# Patient Record
Sex: Female | Born: 1980 | Race: White | Hispanic: No | Smoking: Former smoker
Health system: Southern US, Community
[De-identification: ages and names within clinical notes are randomized; demographics above are authoritative.]

## PROBLEM LIST (undated history)

## (undated) DIAGNOSIS — K219 Gastro-esophageal reflux disease without esophagitis: Secondary | ICD-10-CM

## (undated) DIAGNOSIS — F419 Anxiety disorder, unspecified: Secondary | ICD-10-CM

## (undated) DIAGNOSIS — M4690 Unspecified inflammatory spondylopathy, site unspecified: Secondary | ICD-10-CM

## (undated) DIAGNOSIS — F329 Major depressive disorder, single episode, unspecified: Secondary | ICD-10-CM

## (undated) DIAGNOSIS — F4310 Post-traumatic stress disorder, unspecified: Secondary | ICD-10-CM

## (undated) DIAGNOSIS — J309 Allergic rhinitis, unspecified: Secondary | ICD-10-CM

## (undated) DIAGNOSIS — F603 Borderline personality disorder: Secondary | ICD-10-CM

## (undated) DIAGNOSIS — F32A Depression, unspecified: Secondary | ICD-10-CM

## (undated) HISTORY — PX: OTHER SURGICAL HISTORY: SHX169

## (undated) HISTORY — PX: EYE SURGERY: SHX253

## (undated) HISTORY — PX: ABDOMINAL HYSTERECTOMY: SHX81

## (undated) HISTORY — PX: TONSILLECTOMY: SUR1361

## (undated) HISTORY — PX: TOOTH EXTRACTION: SUR596

---

## 2007-12-05 ENCOUNTER — Emergency Department: Payer: Self-pay | Admitting: Emergency Medicine

## 2008-08-30 ENCOUNTER — Ambulatory Visit: Payer: Self-pay | Admitting: Rheumatology

## 2008-09-29 ENCOUNTER — Other Ambulatory Visit: Payer: Self-pay | Admitting: Diagnostic Radiology

## 2008-09-30 ENCOUNTER — Ambulatory Visit: Payer: Self-pay | Admitting: Rheumatology

## 2009-01-22 ENCOUNTER — Ambulatory Visit: Payer: Self-pay | Admitting: Family Medicine

## 2009-02-27 ENCOUNTER — Ambulatory Visit: Payer: Self-pay | Admitting: Neurology

## 2009-05-31 ENCOUNTER — Emergency Department: Payer: Self-pay | Admitting: Emergency Medicine

## 2009-06-05 ENCOUNTER — Emergency Department: Payer: Self-pay | Admitting: Emergency Medicine

## 2009-07-15 ENCOUNTER — Emergency Department: Payer: Self-pay | Admitting: Emergency Medicine

## 2009-08-18 ENCOUNTER — Observation Stay: Payer: Self-pay | Admitting: Obstetrics and Gynecology

## 2009-09-24 ENCOUNTER — Observation Stay: Payer: Self-pay | Admitting: Obstetrics and Gynecology

## 2009-10-18 ENCOUNTER — Observation Stay: Payer: Self-pay

## 2009-10-30 ENCOUNTER — Observation Stay: Payer: Self-pay

## 2009-11-22 ENCOUNTER — Observation Stay: Payer: Self-pay

## 2009-11-29 ENCOUNTER — Inpatient Hospital Stay: Payer: Self-pay

## 2010-03-12 ENCOUNTER — Emergency Department: Payer: Self-pay | Admitting: Emergency Medicine

## 2010-04-02 ENCOUNTER — Ambulatory Visit: Payer: Self-pay | Admitting: Obstetrics & Gynecology

## 2010-04-09 ENCOUNTER — Ambulatory Visit: Payer: Self-pay | Admitting: Obstetrics & Gynecology

## 2010-04-13 ENCOUNTER — Emergency Department: Payer: Self-pay | Admitting: Emergency Medicine

## 2011-02-04 ENCOUNTER — Emergency Department: Payer: Self-pay | Admitting: *Deleted

## 2012-03-30 ENCOUNTER — Ambulatory Visit: Payer: Self-pay | Admitting: Pain Medicine

## 2013-03-21 ENCOUNTER — Emergency Department: Payer: Self-pay | Admitting: Emergency Medicine

## 2014-05-22 ENCOUNTER — Emergency Department: Payer: Self-pay | Admitting: Emergency Medicine

## 2015-05-11 ENCOUNTER — Ambulatory Visit
Admission: RE | Admit: 2015-05-11 | Discharge: 2015-05-11 | Disposition: A | Discharge status: Home / Self Care | Payer: Medicare HMO | Source: Ambulatory Visit | Attending: Gastroenterology | Admitting: Gastroenterology

## 2015-05-11 ENCOUNTER — Ambulatory Visit: Payer: Medicare HMO | Admitting: Anesthesiology

## 2015-05-11 ENCOUNTER — Encounter
Admission: RE | Disposition: A | Discharge status: Home / Self Care | Payer: Self-pay | Source: Ambulatory Visit | Attending: Gastroenterology

## 2015-05-11 ENCOUNTER — Encounter: Payer: Self-pay | Admitting: *Deleted

## 2015-05-11 DIAGNOSIS — Z91018 Allergy to other foods: Secondary | ICD-10-CM | POA: Insufficient documentation

## 2015-05-11 DIAGNOSIS — F329 Major depressive disorder, single episode, unspecified: Secondary | ICD-10-CM | POA: Diagnosis not present

## 2015-05-11 DIAGNOSIS — Z888 Allergy status to other drugs, medicaments and biological substances status: Secondary | ICD-10-CM | POA: Insufficient documentation

## 2015-05-11 DIAGNOSIS — K640 First degree hemorrhoids: Secondary | ICD-10-CM | POA: Diagnosis not present

## 2015-05-11 DIAGNOSIS — F603 Borderline personality disorder: Secondary | ICD-10-CM | POA: Diagnosis not present

## 2015-05-11 DIAGNOSIS — Z91040 Latex allergy status: Secondary | ICD-10-CM | POA: Diagnosis not present

## 2015-05-11 DIAGNOSIS — Z87891 Personal history of nicotine dependence: Secondary | ICD-10-CM | POA: Insufficient documentation

## 2015-05-11 DIAGNOSIS — Z79899 Other long term (current) drug therapy: Secondary | ICD-10-CM | POA: Diagnosis not present

## 2015-05-11 DIAGNOSIS — Z800 Family history of malignant neoplasm of digestive organs: Secondary | ICD-10-CM | POA: Diagnosis not present

## 2015-05-11 DIAGNOSIS — Z90710 Acquired absence of both cervix and uterus: Secondary | ICD-10-CM | POA: Insufficient documentation

## 2015-05-11 DIAGNOSIS — Z8249 Family history of ischemic heart disease and other diseases of the circulatory system: Secondary | ICD-10-CM | POA: Diagnosis not present

## 2015-05-11 DIAGNOSIS — Z7982 Long term (current) use of aspirin: Secondary | ICD-10-CM | POA: Insufficient documentation

## 2015-05-11 DIAGNOSIS — Z803 Family history of malignant neoplasm of breast: Secondary | ICD-10-CM | POA: Insufficient documentation

## 2015-05-11 DIAGNOSIS — Z8349 Family history of other endocrine, nutritional and metabolic diseases: Secondary | ICD-10-CM | POA: Diagnosis not present

## 2015-05-11 DIAGNOSIS — J309 Allergic rhinitis, unspecified: Secondary | ICD-10-CM | POA: Insufficient documentation

## 2015-05-11 DIAGNOSIS — K529 Noninfective gastroenteritis and colitis, unspecified: Secondary | ICD-10-CM | POA: Insufficient documentation

## 2015-05-11 DIAGNOSIS — F4310 Post-traumatic stress disorder, unspecified: Secondary | ICD-10-CM | POA: Diagnosis not present

## 2015-05-11 DIAGNOSIS — F419 Anxiety disorder, unspecified: Secondary | ICD-10-CM | POA: Diagnosis not present

## 2015-05-11 DIAGNOSIS — M459 Ankylosing spondylitis of unspecified sites in spine: Secondary | ICD-10-CM | POA: Diagnosis not present

## 2015-05-11 DIAGNOSIS — K5900 Constipation, unspecified: Secondary | ICD-10-CM | POA: Insufficient documentation

## 2015-05-11 DIAGNOSIS — K219 Gastro-esophageal reflux disease without esophagitis: Secondary | ICD-10-CM | POA: Insufficient documentation

## 2015-05-11 HISTORY — DX: Post-traumatic stress disorder, unspecified: F43.10

## 2015-05-11 HISTORY — DX: Anxiety disorder, unspecified: F41.9

## 2015-05-11 HISTORY — PX: COLONOSCOPY WITH PROPOFOL: SHX5780

## 2015-05-11 HISTORY — DX: Depression, unspecified: F32.A

## 2015-05-11 HISTORY — DX: Gastro-esophageal reflux disease without esophagitis: K21.9

## 2015-05-11 HISTORY — DX: Major depressive disorder, single episode, unspecified: F32.9

## 2015-05-11 HISTORY — DX: Borderline personality disorder: F60.3

## 2015-05-11 HISTORY — DX: Unspecified inflammatory spondylopathy, site unspecified: M46.90

## 2015-05-11 HISTORY — DX: Allergic rhinitis, unspecified: J30.9

## 2015-05-11 SURGERY — COLONOSCOPY WITH PROPOFOL
Anesthesia: General

## 2015-05-11 MED ORDER — LIDOCAINE HCL (PF) 2 % IJ SOLN
INTRAMUSCULAR | Status: DC | PRN
  Administered 2015-05-11: 60 mg

## 2015-05-11 MED ORDER — MIDAZOLAM HCL 5 MG/5ML IJ SOLN
INTRAMUSCULAR | Status: DC | PRN
  Administered 2015-05-11: 1 mg via INTRAVENOUS

## 2015-05-11 MED ORDER — SODIUM CHLORIDE 0.9 % IV SOLN
INTRAVENOUS | Status: DC
  Administered 2015-05-11: 09:00:00 via INTRAVENOUS

## 2015-05-11 MED ORDER — FENTANYL CITRATE (PF) 100 MCG/2ML IJ SOLN
INTRAMUSCULAR | Status: DC | PRN
  Administered 2015-05-11: 50 ug via INTRAVENOUS

## 2015-05-11 MED ORDER — PROPOFOL 10 MG/ML IV BOLUS
INTRAVENOUS | Status: DC | PRN
  Administered 2015-05-11: 50 mg via INTRAVENOUS

## 2015-05-11 MED ORDER — PROPOFOL 500 MG/50ML IV EMUL
INTRAVENOUS | Status: DC | PRN
  Administered 2015-05-11: 100 ug/kg/min via INTRAVENOUS

## 2015-05-11 NOTE — Discharge Instructions (Signed)

## 2015-05-11 NOTE — Transfer of Care (Signed)
Immediate Anesthesia Transfer of Care Note  Patient: Elaine Arnold  Procedure(s) Performed: Procedure(s): COLONOSCOPY WITH PROPOFOL (N/A)  Patient Location: PACU  Anesthesia Type:General  Level of Consciousness: sedated  Airway & Oxygen Therapy: Patient Spontanous Breathing and Patient connected to nasal cannula oxygen  Post-op Assessment: Report given to RN and Post -op Vital signs reviewed and stable  Post vital signs: Reviewed  Last Vitals:  Filed Vitals:   05/11/15 0855 05/11/15 0957  BP: 117/65 97/54  Pulse: 74 66  Temp: 36.7 C 36.5 C  Resp: 20 18    Complications: No apparent anesthesia complications

## 2015-05-11 NOTE — H&P (Signed)
Primary Care Physician:  Dione Housekeeperlmedo, Mario Ernesto, MD  Pre-Procedure History & Physical: HPI:  Elaine Arnold is a 35 y.o. female is here for an colonoscopy.   Past Medical History  Diagnosis Date  . Allergic rhinitis   . Spondylitis (HCC)   . Anxiety   . Borderline personality disorder   . Depression   . PTSD (post-traumatic stress disorder)   . GERD (gastroesophageal reflux disease)     Past Surgical History  Procedure Laterality Date  . Tonsillectomy    . Abdominal hysterectomy    . Vaginal wall repair    . Tooth extraction    . Eye surgery      Prior to Admission medications   Medication Sig Start Date End Date Taking? Authorizing Provider  acetaminophen (TYLENOL) 500 MG tablet Take 500 mg by mouth every 6 (six) hours as needed.   Yes Historical Provider, MD  Adalimumab (HUMIRA) 40 MG/0.8ML PSKT Inject 40 mg into the skin every 30 (thirty) days.   Yes Historical Provider, MD  aspirin-acetaminophen-caffeine (EXCEDRIN MIGRAINE) 620 022 8338250-250-65 MG tablet Take 2 tablets by mouth every 6 (six) hours as needed for headache.   Yes Historical Provider, MD  cetirizine (ZYRTEC) 10 MG tablet Take 10 mg by mouth daily.   Yes Historical Provider, MD  chlorhexidine (PERIDEX) 0.12 % solution Use as directed 15 mLs in the mouth or throat 2 (two) times daily.   Yes Historical Provider, MD  citalopram (CELEXA) 20 MG tablet Take 40 mg by mouth daily.   Yes Historical Provider, MD  docusate sodium (COLACE) 100 MG capsule Take 100 mg by mouth 2 (two) times daily.   Yes Historical Provider, MD  lactulose (CEPHULAC) 10 g packet Take 10 g by mouth 3 (three) times daily.   Yes Historical Provider, MD  meloxicam (MOBIC) 15 MG tablet Take 15 mg by mouth daily.   Yes Historical Provider, MD  metaxalone (SKELAXIN) 800 MG tablet Take 800 mg by mouth 3 (three) times daily.   Yes Historical Provider, MD  pregabalin (LYRICA) 50 MG capsule Take 50 mg by mouth 3 (three) times daily.   Yes Historical Provider, MD   ranitidine (ZANTAC) 150 MG tablet Take 150 mg by mouth 2 (two) times daily.   Yes Historical Provider, MD  SUMAtriptan (IMITREX) 25 MG tablet Take 50 mg by mouth every 2 (two) hours as needed for migraine. May repeat in 2 hours if headache persists or recurs.   Yes Historical Provider, MD    Allergies as of 05/01/2015  . (Not on File)    History reviewed. No pertinent family history.  Social History   Social History  . Marital Status: Divorced    Spouse Name: N/A  . Number of Children: N/A  . Years of Education: N/A   Occupational History  . Not on file.   Social History Main Topics  . Smoking status: Former Games developermoker  . Smokeless tobacco: Not on file  . Alcohol Use: 0.0 oz/week    0 Glasses of wine per week  . Drug Use: No  . Sexual Activity: Not on file   Other Topics Concern  . Not on file   Social History Narrative     Physical Exam: BP 117/65 mmHg  Pulse 74  Temp(Src) 98.1 F (36.7 C) (Tympanic)  Resp 20  Ht 5\' 3"  (1.6 m)  Wt 98.431 kg (217 lb)  BMI 38.45 kg/m2  SpO2 100% General:   Alert,  pleasant and cooperative in NAD Head:  Normocephalic  and atraumatic. Neck:  Supple; no masses or thyromegaly. Lungs:  Clear throughout to auscultation.    Heart:  Regular rate and rhythm. Abdomen:  Soft, nontender and nondistended. Normal bowel sounds, without guarding, and without rebound.   Neurologic:  Alert and  oriented x4;  grossly normal neurologically.  Impression/Plan: Elaine Arnold is here for an colonoscopy to be performed for constipation  Risks, benefits, limitations, and alternatives regarding  colonoscopy have been reviewed with the patient.  Questions have been answered.  All parties agreeable.   Elaine Maxwell, MD  05/11/2015, 9:22 AM

## 2015-05-11 NOTE — Op Note (Signed)
[facility] Gastroenterology Patient Name: Elaine Arnold Procedure Date: 05/11/2015 9:24 AM MRN: [id number] Account #: [id number] Date of Birth: 1981/03/02 Admit Type: Outpatient Age: 35 Room: [facility] ENDO ROOM 4 Gender: Female Note Status: Finalized Procedure:            Colonoscopy Indications:          This is the patient's first colonoscopy, Change in                        stool caliber, Constipation Patient Profile:      This is a 35 year old female. Providers:            Rhona Raider. Shelle Iron, MD Referring MD:         Nat Christen. Zada Finders, MD (Referring MD) Medicines:            Propofol per Anesthesia Complications:        No immediate complications. Procedure:            Pre-Anesthesia Assessment:                       - Prior to the procedure, a History and Physical was                        performed, and patient medications, allergies and                        sensitivities were reviewed. The patient's tolerance of                        previous anesthesia was reviewed.                       - Prior to the procedure, a History and Physical was                        performed, and patient medications, allergies and                        sensitivities were reviewed. The patient's tolerance of                        previous anesthesia was reviewed.                       After obtaining informed consent, the colonoscope was                        passed under direct vision. Throughout the procedure,                        the patient's blood pressure, pulse, and oxygen                        saturations were monitored continuously. The                        Colonoscope was introduced through the anus and                        advanced to the the terminal ileum. The patient  tolerated the procedure well. The quality of the bowel                        preparation was good. Findings:      The perianal and digital rectal examinations were  normal.      Internal hemorrhoids were found during retroflexion. The hemorrhoids       were Grade I (internal hemorrhoids that do not prolapse).      A scattered area of mildly erythematous mucosa was found in the distal       rectum. Biopsies were taken with a cold forceps for histology. ? Prep       Artifact.      The exam was otherwise without abnormality on direct and retroflexion       views. Impression:           - Erythematous mucosa in the distal rectum. Biopsied. ?                        Prep artifact                       - The examination was otherwise normal on direct and                        retroflexion views. Recommendation:       - Observe patient in GI recovery unit.                       - Resume regular diet.                       - Continue present medications.                       - Await pathology results.                       - Recommend GI PT consult for likely pelvic floor                        dyssynchrony.                       - The findings and recommendations were discussed with                        the patient.                       - The findings and recommendations were discussed with                        the patient's family. Procedure Code(s):    --- Professional ---                       [phone], Colonoscopy, flexible; with biopsy, single or                        multiple Diagnosis Code(s):    --- Professional ---                       K62.89, Other specified diseases of anus and rectum  R19.5, Other fecal abnormalities                       K59.00, Constipation, unspecified CPT copyright 2016 American Medical Association. All rights reserved. The codes documented in this report are preliminary and upon coder review may  be revised to meet current compliance requirements. Kathalene FramesMatthew G Rein, MD 05/11/2015 10:02:54 AM This report has been signed electronically. Number of Addenda: 0 Note Initiated On: 05/11/2015 9:24  AM Scope Withdrawal Time: 0 hours 15 minutes 0 seconds  Total Procedure Duration: 0 hours 25 minutes 13 seconds       [facility]

## 2015-05-11 NOTE — Anesthesia Postprocedure Evaluation (Signed)
Anesthesia Post Note  Patient: Elaine Arnold  Procedure(s) Performed: Procedure(s) (LRB): COLONOSCOPY WITH PROPOFOL (N/A)  Patient location during evaluation: Endoscopy Anesthesia Type: General Level of consciousness: awake and alert Pain management: pain level controlled Vital Signs Assessment: post-procedure vital signs reviewed and stable Respiratory status: spontaneous breathing, nonlabored ventilation, respiratory function stable and patient connected to nasal cannula oxygen Cardiovascular status: blood pressure returned to baseline and stable Postop Assessment: no signs of nausea or vomiting Anesthetic complications: no    Last Vitals:  Filed Vitals:   05/11/15 1020 05/11/15 1030  BP: 101/73 99/78  Pulse: 62 64  Temp:    Resp: 13 12    Last Pain: There were no vitals filed for this visit.               Cleda MccreedyJoseph K Piscitello

## 2015-05-11 NOTE — Anesthesia Preprocedure Evaluation (Signed)
Anesthesia Evaluation  Patient identified by MRN, date of birth, ID band Patient awake    Reviewed: Allergy & Precautions, H&P , NPO status , Patient's Chart, lab work & pertinent test results  History of Anesthesia Complications Negative for: history of anesthetic complications  Airway Mallampati: III  TM Distance: >3 FB Neck ROM: limited    Dental  (+) Poor Dentition   Pulmonary neg shortness of breath, former smoker,    Pulmonary exam normal breath sounds clear to auscultation       Cardiovascular Exercise Tolerance: Good (-) angina(-) Past MI and (-) DOE Normal cardiovascular exam Rhythm:regular Rate:Normal     Neuro/Psych PSYCHIATRIC DISORDERS Anxiety Depression negative neurological ROS     GI/Hepatic Neg liver ROS, GERD  Controlled,  Endo/Other  negative endocrine ROS  Renal/GU negative Renal ROS  negative genitourinary   Musculoskeletal negative musculoskeletal ROS (+)   Abdominal   Peds negative pediatric ROS (+)  Hematology negative hematology ROS (+)   Anesthesia Other Findings Past Medical History:   Allergic rhinitis                                            Spondylitis (HCC)                                            Anxiety                                                      Borderline personality disorder                              Depression                                                   PTSD (post-traumatic stress disorder)                        GERD (gastroesophageal reflux disease)                      Past Surgical History:   TONSILLECTOMY                                                 ABDOMINAL HYSTERECTOMY                                        vaginal wall repair                                           TOOTH EXTRACTION  EYE SURGERY                                                  BMI    Body Mass Index   38.44 kg/m 2      Reproductive/Obstetrics negative OB ROS                             Anesthesia Physical Anesthesia Plan  ASA: III  Anesthesia Plan: General   Post-op Pain Management:    Induction:   Airway Management Planned:   Additional Equipment:   Intra-op Plan:   Post-operative Plan:   Informed Consent: I have reviewed the patients History and Physical, chart, labs and discussed the procedure including the risks, benefits and alternatives for the proposed anesthesia with the patient or authorized representative who has indicated his/her understanding and acceptance.   Dental Advisory Given  Plan Discussed with: Anesthesiologist, CRNA and Surgeon  Anesthesia Plan Comments:         Anesthesia Quick Evaluation

## 2015-05-12 ENCOUNTER — Encounter: Payer: Self-pay | Admitting: Gastroenterology

## 2015-05-14 LAB — SURGICAL PATHOLOGY

## 2015-06-05 ENCOUNTER — Other Ambulatory Visit: Payer: Self-pay | Admitting: Neurology

## 2015-06-05 DIAGNOSIS — R29898 Other symptoms and signs involving the musculoskeletal system: Secondary | ICD-10-CM

## 2015-06-05 DIAGNOSIS — M542 Cervicalgia: Secondary | ICD-10-CM

## 2015-06-25 ENCOUNTER — Ambulatory Visit: Payer: Medicare HMO | Attending: Neurology | Admitting: Occupational Therapy

## 2015-06-25 DIAGNOSIS — M6281 Muscle weakness (generalized): Secondary | ICD-10-CM | POA: Diagnosis present

## 2015-06-25 DIAGNOSIS — R202 Paresthesia of skin: Secondary | ICD-10-CM | POA: Diagnosis present

## 2015-06-25 NOTE — Therapy (Signed)
[facility] PHYSICAL AND SPORTS MEDICINE [address] [place], [place], [address] Phone: [phone]   Fax:  [phone]  Occupational Therapy Treatment  Patient Details  Name: Elaine Arnold MRN: [id number] Date of Birth: 09/10/1980 Referring Provider: Sherryll BurgerShah  Encounter Date: 06/25/2015      OT End of Session - 06/25/15 1447    Visit Number 1   Number of Visits 4   Date for OT Re-Evaluation 07/23/15   OT Start Time 1133   OT Stop Time 1223   OT Time Calculation (min) 50 min   Activity Tolerance Patient tolerated treatment well   Behavior During Therapy [facility] for tasks assessed/performed      Past Medical History  Diagnosis Date  . Allergic rhinitis   . Spondylitis (HCC)   . Anxiety   . Borderline personality disorder   . Depression   . PTSD (post-traumatic stress disorder)   . GERD (gastroesophageal reflux disease)     Past Surgical History  Procedure Laterality Date  . Tonsillectomy    . Abdominal hysterectomy    . Vaginal wall repair    . Tooth extraction    . Eye surgery    . Colonoscopy with propofol N/A 05/11/2015    Procedure: COLONOSCOPY WITH PROPOFOL;  Surgeon: Elnita MaxwellMatthew Gordon Rein, MD;  [facility];  Service: Endoscopy;  Laterality: N/A;    There were no vitals filed for this visit.      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 Yes   Pain Score 3    Pain Location Arm   Pain Orientation Right;Left   Pain Descriptors / Indicators Pins and needles            [facility] OT Assessment - 06/25/15 0001    Assessment   Diagnosis Bilateral arm weakness    Referring Provider  Sherryll BurgerShah   Onset Date 01/02/15   Home  Environment   Lives With Family   Prior Function   Level of Independence Independent  on bad days she needs help with ADL"s and legs gets weak   Vocation Part time employment   Leisure Play games ( video and board) , drawing , reading , gardening    Strength   Right Hand Grip (lbs) 68   Right Hand Lateral Pinch 20 lbs   Right Hand 3 Point Pinch 20 lbs   Left Hand Grip (lbs) 37   Left Hand Lateral Pinch 15 lbs   Left Hand 3 Point Pinch 19 lbs                          OT Education - 06/25/15 1447    Education provided Yes   Person(s) Educated Patient   Methods Explanation;Demonstration;Tactile cues;Verbal cues;Handout   Comprehension Verbal cues required;Returned demonstration;Verbalized understanding          OT Short Term Goals - 06/25/15 1501    OT SHORT TERM GOAL #1   Title Pt grip strength in L hand improve wth 10 lbs to  hold pots Arnold    Baseline R grip 68; L 37lbs    Time 3   Period Weeks   Status New           OT Long Term Goals - 06/25/15 1501    OT LONG TERM GOAL #1   Title Pt show improvement in function on PRWHE by 10 points by doing HEP   Baseline Function score 24/50 on PRWHE    Time 4   Period Weeks   Status New      Pt ed on HEP - see pt instruction           Plan - 06/25/15 1448    Clinical Impression Statement Pt refer by DR Sherryll Burger for bilateral UE weakness and numbness - pt tested normal sensation on Sem[facility] Weinstein - pt show decrease strength in L UE from shoulder to wrist 4-/5 compare to R 4+/5 - fatigue quick  with increase reps or resistance ; pt  grip decrease in L more than R -  and below norm - Prehension strenght in  R hand L was  within normal range - pt has cervical MRI tomorrow - had normal  nerve conduction -pt was provided HEP for grip strengthening    Rehab Potential Fair   OT Frequency 1x / week   OT Duration 4 weeks   OT Treatment/Interventions Self-care/ADL  training;DME and/or AE instruction;Patient/family education;Therapeutic exercises   Plan Check on MRI results ,   OT Home Exercise Plan see pt instruction    Consulted and Agree with Plan of Care Patient      Patient will benefit from skilled therapeutic intervention in order to improve the following deficits and impairments:  Decreased strength, Impaired UE functional use, Impaired sensation  Visit Diagnosis: Tingling sensation - Plan: Ot plan of care cert/re-cert  Muscle weakness (generalized) - Plan: Ot plan of care cert/re-cert    Problem List There are no active problems to display for this patient.   Oletta Cohn OTR/L,CLT  06/25/2015, 3:09 PM  [facility] PHYSICAL AND SPORTS MEDICINE [address] [place], [address] Phone: [phone]   Fax:  [phone]  Name: Elaine Arnold MRN: [id number] Date of Birth: Aug 29, 1980

## 2015-06-25 NOTE — Patient Instructions (Signed)
Pt was provided HEP for green putty for grip , pulling with ulnar 2 digits, and pulling 5 fingers ( taffy)  10-12 reps  1-2 x day

## 2015-06-26 ENCOUNTER — Ambulatory Visit
Admission: RE | Admit: 2015-06-26 | Discharge: 2015-06-26 | Disposition: A | Discharge status: Home / Self Care | Payer: Medicare HMO | Source: Ambulatory Visit | Attending: Neurology | Admitting: Neurology

## 2015-06-26 DIAGNOSIS — R200 Anesthesia of skin: Secondary | ICD-10-CM | POA: Insufficient documentation

## 2015-06-26 DIAGNOSIS — R29898 Other symptoms and signs involving the musculoskeletal system: Secondary | ICD-10-CM | POA: Diagnosis present

## 2015-06-26 DIAGNOSIS — M50223 Other cervical disc displacement at C6-C7 level: Secondary | ICD-10-CM | POA: Insufficient documentation

## 2015-06-26 DIAGNOSIS — M542 Cervicalgia: Secondary | ICD-10-CM | POA: Diagnosis present

## 2015-07-05 ENCOUNTER — Ambulatory Visit: Payer: Medicare HMO | Admitting: Occupational Therapy

## 2015-07-11 ENCOUNTER — Ambulatory Visit: Payer: Medicare HMO | Attending: Neurology | Admitting: Occupational Therapy

## 2015-07-11 DIAGNOSIS — M6281 Muscle weakness (generalized): Secondary | ICD-10-CM | POA: Insufficient documentation

## 2015-07-11 DIAGNOSIS — R202 Paresthesia of skin: Secondary | ICD-10-CM | POA: Insufficient documentation
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
## 2015-07-11 NOTE — Patient Instructions (Signed)
Cont with HEP   Pt to do putty and exercises with not tight grip - "soft hands"  Cont with same putty   add 1 lbs for wrist in all planes - do need to focus on keep alignment of wrist and hand good during extention and pronation/sup - wants to deviate or flex  10 reps each  2 lbs for elbow flexion in supination and neutral - not able to do in pronation  10 reps each  Triceps 2 lbs - 10 reps  Add to HEP   Keep pain under 2/10

## 2015-07-19 ENCOUNTER — Ambulatory Visit: Payer: Medicare HMO | Admitting: Occupational Therapy

## 2015-11-13 ENCOUNTER — Encounter: Payer: Self-pay | Admitting: Emergency Medicine

## 2015-11-13 ENCOUNTER — Emergency Department: Payer: Medicare HMO

## 2015-11-13 ENCOUNTER — Emergency Department
Admission: EM | Admit: 2015-11-13 | Discharge: 2015-11-13 | Disposition: A | Discharge status: Home / Self Care | Payer: Medicare HMO | Attending: Emergency Medicine | Admitting: Emergency Medicine

## 2015-11-13 DIAGNOSIS — Z91040 Latex allergy status: Secondary | ICD-10-CM | POA: Insufficient documentation

## 2015-11-13 DIAGNOSIS — Z87891 Personal history of nicotine dependence: Secondary | ICD-10-CM | POA: Diagnosis not present

## 2015-11-13 DIAGNOSIS — Z791 Long term (current) use of non-steroidal anti-inflammatories (NSAID): Secondary | ICD-10-CM | POA: Insufficient documentation

## 2015-11-13 DIAGNOSIS — R1030 Lower abdominal pain, unspecified: Secondary | ICD-10-CM | POA: Insufficient documentation

## 2015-11-13 DIAGNOSIS — Z79899 Other long term (current) drug therapy: Secondary | ICD-10-CM | POA: Diagnosis not present

## 2015-11-13 DIAGNOSIS — M545 Low back pain, unspecified: Secondary | ICD-10-CM

## 2015-11-13 DIAGNOSIS — R109 Unspecified abdominal pain: Secondary | ICD-10-CM

## 2015-11-13 LAB — URINALYSIS COMPLETE WITH MICROSCOPIC (ARMC ONLY)
Bacteria, UA: NONE SEEN
Bilirubin Urine: NEGATIVE
GLUCOSE, UA: NEGATIVE mg/dL
Hgb urine dipstick: NEGATIVE
KETONES UR: NEGATIVE mg/dL
Leukocytes, UA: NEGATIVE
Nitrite: NEGATIVE
PROTEIN: NEGATIVE mg/dL
SQUAMOUS EPITHELIAL / LPF: NONE SEEN
Specific Gravity, Urine: 1.002 — ABNORMAL LOW (ref 1.005–1.030)
pH: 6 (ref 5.0–8.0)

## 2015-11-13 LAB — COMPREHENSIVE METABOLIC PANEL
ALBUMIN: 4.4 g/dL (ref 3.5–5.0)
ALK PHOS: 100 U/L (ref 38–126)
ALT: 28 U/L (ref 14–54)
AST: 23 U/L (ref 15–41)
Anion gap: 7 (ref 5–15)
BUN: 15 mg/dL (ref 6–20)
CALCIUM: 9.7 mg/dL (ref 8.9–10.3)
CHLORIDE: 106 mmol/L (ref 101–111)
CO2: 25 mmol/L (ref 22–32)
CREATININE: 0.67 mg/dL (ref 0.44–1.00)
GFR calc non Af Amer: >60 mL/min (ref >60)
GLUCOSE: 87 mg/dL (ref 65–99)
Potassium: 3.8 mmol/L (ref 3.5–5.1)
SODIUM: 138 mmol/L (ref 135–145)
Total Bilirubin: 0.2 mg/dL — ABNORMAL LOW (ref 0.3–1.2)
Total Protein: 7.9 g/dL (ref 6.5–8.1)

## 2015-11-13 LAB — CBC
HCT: 37.4 % (ref 35.0–47.0)
Hemoglobin: 12.9 g/dL (ref 12.0–16.0)
MCH: 28.1 pg (ref 26.0–34.0)
MCHC: 34.5 g/dL (ref 32.0–36.0)
MCV: 81.4 fL (ref 80.0–100.0)
PLATELETS: 253 10*3/uL (ref 150–440)
RBC: 4.6 MIL/uL (ref 3.80–5.20)
RDW: 13.1 % (ref 11.5–14.5)
WBC: 7.3 10*3/uL (ref 3.6–11.0)

## 2015-11-13 LAB — LIPASE, BLOOD: LIPASE: 28 U/L (ref 11–51)

## 2015-11-13 MED ORDER — KETOROLAC TROMETHAMINE 30 MG/ML IJ SOLN
INTRAMUSCULAR | Status: AC
  Filled 2015-11-13: qty 1

## 2015-11-13 MED ORDER — LIDOCAINE 5 % EX PTCH
MEDICATED_PATCH | CUTANEOUS | Status: AC
  Administered 2015-11-13: 1 via TRANSDERMAL
  Filled 2015-11-13: qty 1

## 2015-11-13 MED ORDER — ONDANSETRON HCL 4 MG/2ML IJ SOLN
4.0000 mg | Freq: Once | INTRAMUSCULAR | Status: AC
  Administered 2015-11-13: 4 mg via INTRAVENOUS

## 2015-11-13 MED ORDER — KETOROLAC TROMETHAMINE 30 MG/ML IJ SOLN
30.0000 mg | Freq: Once | INTRAMUSCULAR | Status: AC
  Administered 2015-11-13: 30 mg via INTRAVENOUS

## 2015-11-13 MED ORDER — ONDANSETRON HCL 4 MG/2ML IJ SOLN
INTRAMUSCULAR | Status: AC
  Filled 2015-11-13: qty 2

## 2015-11-13 MED ORDER — HYDROMORPHONE HCL 1 MG/ML IJ SOLN
INTRAMUSCULAR | Status: AC
  Filled 2015-11-13: qty 1

## 2015-11-13 MED ORDER — ONDANSETRON 4 MG PO TBDP
4.0000 mg | ORAL_TABLET | Freq: Once | ORAL | Status: DC
  Filled 2015-11-13: qty 1

## 2015-11-13 MED ORDER — KETOROLAC TROMETHAMINE 30 MG/ML IJ SOLN
30.0000 mg | Freq: Once | INTRAMUSCULAR | Status: AC
  Administered 2015-11-13: 30 mg via INTRAMUSCULAR
  Filled 2015-11-13: qty 1

## 2015-11-13 MED ORDER — HYDROMORPHONE HCL 1 MG/ML IJ SOLN
0.5000 mg | Freq: Once | INTRAMUSCULAR | Status: AC
  Administered 2015-11-13: 0.5 mg via INTRAVENOUS

## 2015-11-13 MED ORDER — LIDOCAINE 5 % EX PTCH
1.0000 | MEDICATED_PATCH | Freq: Once | CUTANEOUS | Status: DC
  Administered 2015-11-13: 1 via TRANSDERMAL

## 2015-11-13 NOTE — ED Notes (Signed)
Pt returned from CT °

## 2015-11-13 NOTE — ED Notes (Signed)
Pt went to CT

## 2015-11-13 NOTE — ED Provider Notes (Signed)
Time Seen: Approximately 2009  I have reviewed the triage notes  Chief Complaint: Back Pain and Abdominal Pain   History of Present Illness: Elaine Arnold is a 35 y.o. female *who presents with some acute onset of some right-sided lower back discomfort lateral to the L2-L3 region. Patient has a long history of musculoskeletal back pain. She felt this pain was somewhat different and point tender with some associated nausea and urinary frequency and also describes some burning with urination. She has some mild lower abdominal discomfort without any fever. She denies any left-sided flank or abdominal pain at this time. She states that there was one area that was point tender. She's had a complete hysterectomy and has no history of bowel issues such as diarrhea or constipation. Patient went to an urgent care and was concerned for the possibility of renal colic since patient's on Topamax and also vitamin D supplements   Past Medical History:  Diagnosis Date  . Allergic rhinitis   . Anxiety   . Borderline personality disorder   . Depression   . GERD (gastroesophageal reflux disease)   . PTSD (post-traumatic stress disorder)   . Spondylitis (HCC)     There are no active problems to display for this patient.   Past Surgical History:  Procedure Laterality Date  . ABDOMINAL HYSTERECTOMY    . COLONOSCOPY WITH PROPOFOL N/A 05/11/2015   Procedure: COLONOSCOPY WITH PROPOFOL;  Surgeon: Elnita Maxwell, MD;  Location: [facility] ENDOSCOPY;  Service: Endoscopy;  Laterality: N/A;  . EYE SURGERY    . TONSILLECTOMY    . TOOTH EXTRACTION    . vaginal wall repair      Past Surgical History:  Procedure Laterality Date  . ABDOMINAL HYSTERECTOMY    . COLONOSCOPY WITH PROPOFOL N/A 05/11/2015   Procedure: COLONOSCOPY WITH PROPOFOL;  Surgeon: Elnita Maxwell, MD;  Location: [facility] ENDOSCOPY;  Service: Endoscopy;  Laterality: N/A;  . EYE SURGERY    . TONSILLECTOMY    . TOOTH EXTRACTION    . vaginal  wall repair      Current Outpatient Rx  . Order #: [id number] Class: Historical Med  . Order #: [id number] Class: Historical Med  . Order #: [id number] Class: Historical Med  . Order #: [id number] Class: Historical Med  . Order #: [id number] Class: Historical Med  . Order #: [id number] Class: Historical Med  . Order #: [id number] Class: Historical Med  . Order #: [id number] Class: Historical Med  . Order #: [id number] Class: Historical Med  . Order #: [id number] Class: Historical Med  . Order #: [id number] Class: Historical Med  . Order #: [id number] Class: Historical Med  . Order #: [id number] Class: Historical Med    Allergies:  Effexor [venlafaxine]; Escitalopram; Latex; Mango flavor; and Propranolol  Family History: No family history on file.  Social History: Social History  Substance Use Topics  . Smoking status: Former Games developer  . Smokeless tobacco: Never Used  . Alcohol use 0.0 oz/week     Review of Systems:   10 point review of systems was performed and was otherwise negative:  Constitutional: No fever Eyes: No visual disturbances ENT: No sore throat, ear pain Cardiac: No chest pain Respiratory: No shortness of breath, wheezing, or stridor Abdomen: No Current abdominal pain, no vomiting, No diarrhea Endocrine: No weight loss, No night sweats Extremities: No peripheral edema, cyanosis Skin: No rashes, easy bruising Neurologic: No focal weakness, trouble with speech or swollowing Urologic: No obvious hematuria She points mainly to the right back region and denies any new weakness in either upper  or lower extremities  Physical Exam:  ED Triage Vitals  Enc Vitals Group     BP 11/13/15 1929 105/65     Pulse Rate 11/13/15 1929 85     Resp 11/13/15 1929 18     Temp 11/13/15 1929 98.4 F (36.9 C)     Temp Source 11/13/15 1929 Oral     SpO2 11/13/15 1929 100 %     Weight 11/13/15 1930 210 lb (95.3 kg)     Height 11/13/15 1930 5\' 3"  (1.6 m)     Head Circumference --      Peak Flow  --      Pain Score 11/13/15 1930 8     Pain Loc --      Pain Edu? --      Excl. in GC? --     General: Awake , Alert , and Oriented times 3; GCS 15 Head: Normal cephalic , atraumatic Eyes: Pupils equal , round, reactive to light Nose/Throat: No nasal drainage, patent upper airway without erythema or exudate.  Neck: Supple, Full range of motion, No anterior adenopathy or palpable thyroid masses Lungs: Clear to ascultation without wheezes , rhonchi, or rales Heart: Regular rate, regular rhythm without murmurs , gallops , or rubs Abdomen: Soft, non tender without rebound, guarding , or rigidity; bowel sounds positive and symmetric in all 4 quadrants. No organomegaly .        Extremities: 2 plus symmetric pulses. No edema, clubbing or cyanosis Neurologic: normal ambulation, Motor symmetric without deficits, sensory intact Skin: warm, dry, no rashes Patient has some point tenderness proximally at the L2-L3 region to direct palpation on the right side. Negative straight leg raise or any signs of [place] can  Labs:   All laboratory work was reviewed including any pertinent negatives or positives listed below:  Labs Reviewed  COMPREHENSIVE METABOLIC PANEL - Abnormal; Notable for the following:       Result Value   Total Bilirubin 0.2 (*)    All other components within normal limits  URINALYSIS COMPLETEWITH MICROSCOPIC ([facility] ONLY) - Abnormal; Notable for the following:    Color, Urine COLORLESS (*)    APPearance CLEAR (*)    Specific Gravity, Urine 1.002 (*)    All other components within normal limits  LIPASE, BLOOD  CBC   Laboratory work was reviewed and showed no clinically significant abnormalities.  Radiology:  "Ct Renal Stone Study  Result Date: 11/13/2015 CLINICAL DATA:  Stabbing low back pain today with urinary frequency and dysuria. EXAM: CT ABDOMEN AND PELVIS WITHOUT CONTRAST TECHNIQUE: Multidetector CT imaging of the abdomen and pelvis was performed following the standard  protocol without IV contrast. COMPARISON:  None. FINDINGS: Lower chest: No acute abnormality. Hepatobiliary: No focal liver abnormality is seen. No gallstones, gallbladder wall thickening, or biliary dilatation. Pancreas: Unremarkable. No pancreatic ductal dilatation or surrounding inflammatory changes. Spleen: Normal in size without focal abnormality. Adrenals/Urinary Tract: Small nonobstructing kidney stones are identified in both kidneys. There is no hydronephrosis bilaterally. The adrenal glands are normal. The bladder is normal. Stomach/Bowel: Stomach is within normal limits. No evidence of bowel wall thickening, distention, or inflammatory changes. Appendix appears normal. Vascular/Lymphatic: No significant vascular findings are present. No enlarged abdominal or pelvic lymph nodes. Reproductive: Status post hysterectomy. No adnexal masses. Other: There are small bilateral inguinal herniation of mesenteric fat and small herniation of mesenteric fat in the umbilicus. Musculoskeletal: No acute or significant osseous findings. IMPRESSION: Bilateral nephrolithiasis without hydroureteronephrosis bilaterally. Prior hysterectomy. Electronically Signed  By: Sherian Rein M.D.   On: 11/13/2015 21:21  "  I personally reviewed the radiologic studies    ED Course:  Patient's stay here was uneventful and it did not appear that she had acute renal colic. There is a presence of kidney stones likely associated with the patient's medications. The patient has point tenderness to palpation this seemed to be more musculoskeletal muscle spasm type of discomfort. I felt was unlikely this was cauda equina or she had a care at this time. Patient had symptomatic improvement with Toradol, Dilaudid, and Zofran.  Clinical Course        Final Clinical Impression:   Final diagnoses:  Acute right flank pain  Right-sided low back pain without sciatica     Plan:  Outpatient " Discharge Medication List as of  11/13/2015  9:48 PM    " Patient states that she has Voltaren gel at home and also some other pain medication she can try. I felt it was necessary to stay away from prescription narcotic pain medication at this time.  Patient was advised to return immediately if condition worsens. Patient was advised to follow up with their primary care physician or other specialized physicians involved in their outpatient care. The patient and/or family member/power of attorney had laboratory results reviewed at the bedside. All questions and concerns were addressed and appropriate discharge instructions were distributed by the nursing staff.           Jennye Moccasin, MD 11/13/15 2224

## 2015-11-13 NOTE — ED Triage Notes (Signed)
Pt presents to ED with c/o lower back pain today, reports when pt stands up pt states feels a tightening and stabbing sensation to her back. Pt c/o nausea, urinary frequency, and dysuria. Pt c/o lower abdominal pain. Pt denies chest pain or shortness of breath. Pt reports was sent from urgent care to rule out kidney stone.

## 2015-11-13 NOTE — Discharge Instructions (Signed)
Please continue your normal pain medication at home when you can use the Voltaren gel along with over-the-counter lidocaine for back discomfort. Please contact your primary physician for further outpatient follow-up . Drink plenty of fluids and return here for any new leg weakness, difficulty with bladder or bowel function, fever or any other new concerns.  Please return immediately if condition worsens. Please contact her primary physician or the physician you were given for referral. If you have any specialist physicians involved in her treatment and plan please also contact them. Thank you for using [facility].

## 2016-08-30 ENCOUNTER — Emergency Department: Payer: Medicare HMO

## 2016-08-30 ENCOUNTER — Emergency Department
Admission: EM | Admit: 2016-08-30 | Discharge: 2016-08-30 | Disposition: A | Discharge status: Home / Self Care | Payer: Medicare HMO | Attending: Emergency Medicine | Admitting: Emergency Medicine

## 2016-08-30 ENCOUNTER — Encounter: Payer: Self-pay | Admitting: Emergency Medicine

## 2016-08-30 DIAGNOSIS — Z79899 Other long term (current) drug therapy: Secondary | ICD-10-CM | POA: Diagnosis not present

## 2016-08-30 DIAGNOSIS — R202 Paresthesia of skin: Secondary | ICD-10-CM | POA: Diagnosis not present

## 2016-08-30 DIAGNOSIS — R569 Unspecified convulsions: Secondary | ICD-10-CM | POA: Diagnosis not present

## 2016-08-30 DIAGNOSIS — R531 Weakness: Secondary | ICD-10-CM | POA: Diagnosis present

## 2016-08-30 LAB — CBC
HCT: 46.3 % (ref 35.0–47.0)
HEMOGLOBIN: 15.7 g/dL (ref 12.0–16.0)
MCH: 27.8 pg (ref 26.0–34.0)
MCHC: 34 g/dL (ref 32.0–36.0)
MCV: 81.8 fL (ref 80.0–100.0)
Platelets: 237 10*3/uL (ref 150–440)
RBC: 5.66 MIL/uL — AB (ref 3.80–5.20)
RDW: 14.7 % — ABNORMAL HIGH (ref 11.5–14.5)
WBC: 6.9 10*3/uL (ref 3.6–11.0)

## 2016-08-30 LAB — BASIC METABOLIC PANEL
ANION GAP: 6 (ref 5–15)
BUN: 23 mg/dL — ABNORMAL HIGH (ref 6–20)
CALCIUM: 9 mg/dL (ref 8.9–10.3)
CHLORIDE: 106 mmol/L (ref 101–111)
CO2: 26 mmol/L (ref 22–32)
Creatinine, Ser: 0.95 mg/dL (ref 0.44–1.00)
GFR calc non Af Amer: >60 mL/min (ref >60)
Glucose, Bld: 89 mg/dL (ref 65–99)
Potassium: 3.4 mmol/L — ABNORMAL LOW (ref 3.5–5.1)
Sodium: 138 mmol/L (ref 135–145)

## 2016-08-30 LAB — URINALYSIS, COMPLETE (UACMP) WITH MICROSCOPIC
BILIRUBIN URINE: NEGATIVE
Bacteria, UA: NONE SEEN
GLUCOSE, UA: NEGATIVE mg/dL
Hgb urine dipstick: NEGATIVE
KETONES UR: NEGATIVE mg/dL
Nitrite: NEGATIVE
PROTEIN: NEGATIVE mg/dL
Specific Gravity, Urine: 1.019 (ref 1.005–1.030)
pH: 6 (ref 5.0–8.0)

## 2016-08-30 NOTE — Discharge Instructions (Signed)
As we discussed it is very important that you do not drive, go up on roofs, swim, or put yourself in any situation that might be dangerous for you or others if you were to have another seizure until you are cleared by a neurologist. Please seek medical attention for any high fevers, chest pain, shortness of breath, change in behavior, persistent vomiting, bloody stool or any other new or concerning symptoms. ° °

## 2016-08-30 NOTE — ED Notes (Signed)
Pt able to ambulate to the bathroom without difficulty and pt denies dizziness at this time time.

## 2016-08-30 NOTE — ED Triage Notes (Addendum)
Pt states that this morning around 0330 she woke up and was confused, pt states that she was disoriented and had urinated and defecated on herself. Pt states that she got up to take a shower and then went back to bed. Pt states that when she woke up she was disoriented, she did not know what time it was or where she was and she felt "cold down there, like I had been bleeding but i've had my uterus removed." Pt states that she drove herself here, pt is A & O x 4 in triage, states that she feels weak, her vision is blurrier than usual, and she has "tingling' in all 4 extremities and in her face.

## 2016-08-30 NOTE — ED Provider Notes (Signed)
[facility] Emergency Department Provider Note  ____________________________________________   I have reviewed the triage vital signs and the nursing notes.   HISTORY  Chief Complaint Weakness and Tingling   History limited by: Not Limited   HPI Elaine Arnold is a 36 y.o. female who presents to the emergency department today because of concerns for an episode that occurred last night. She states that she woke up about 3:30 AM with cramping in all 4 extremities. She states she was very confused at that time. She was noted to have urinated and defecated on herself. He started to become more oriented and was able to shower. She then went back to bed. The patient states that today after waking up she is felt off. She has felt like her head is been heavy. She has no history of seizures.   Past Medical History:  Diagnosis Date  . Allergic rhinitis   . Anxiety   . Borderline personality disorder   . Depression   . GERD (gastroesophageal reflux disease)   . PTSD (post-traumatic stress disorder)   . Spondylitis (HCC)     There are no active problems to display for this patient.   Past Surgical History:  Procedure Laterality Date  . ABDOMINAL HYSTERECTOMY    . COLONOSCOPY WITH PROPOFOL N/A 05/11/2015   Procedure: COLONOSCOPY WITH PROPOFOL;  Surgeon: Elnita Maxwell, MD;  Location: [facility] ENDOSCOPY;  Service: Endoscopy;  Laterality: N/A;  . EYE SURGERY    . TONSILLECTOMY    . TOOTH EXTRACTION    . vaginal wall repair      Prior to Admission medications   Medication Sig Start Date End Date Taking? Authorizing Provider  acetaminophen (TYLENOL) 500 MG tablet Take 500 mg by mouth every 6 (six) hours as needed.    [provider]  Adalimumab (HUMIRA) 40 MG/0.8ML PSKT Inject 40 mg into the skin every 30 (thirty) days.    [provider]  aspirin-acetaminophen-caffeine (EXCEDRIN MIGRAINE) [phone] MG tablet Take 2 tablets by mouth every 6  (six) hours as needed for headache.    [provider]  cetirizine (ZYRTEC) 10 MG tablet Take 10 mg by mouth daily.    [provider]  chlorhexidine (PERIDEX) 0.12 % solution Use as directed 15 mLs in the mouth or throat 2 (two) times daily.    [provider]  citalopram (CELEXA) 20 MG tablet Take 40 mg by mouth daily.    [provider]  docusate sodium (COLACE) 100 MG capsule Take 100 mg by mouth 2 (two) times daily.    [provider]  lactulose (CEPHULAC) 10 g packet Take 10 g by mouth 3 (three) times daily.    [provider]  meloxicam (MOBIC) 15 MG tablet Take 15 mg by mouth daily.    [provider]  metaxalone (SKELAXIN) 800 MG tablet Take 800 mg by mouth 3 (three) times daily.    [provider]  pregabalin (LYRICA) 50 MG capsule Take 50 mg by mouth 3 (three) times daily.    [provider]  ranitidine (ZANTAC) 150 MG tablet Take 150 mg by mouth 2 (two) times daily.    [provider]  SUMAtriptan (IMITREX) 25 MG tablet Take 50 mg by mouth every 2 (two) hours as needed for migraine. May repeat in 2 hours if headache persists or recurs.    [provider]    Allergies Effexor [venlafaxine]; Escitalopram; Latex; Mango flavor; and Propranolol  No family history on  file.  Social History Social History  Substance Use Topics  . Smoking status: Former Games developer  . Smokeless tobacco: Never Used  . Alcohol use 0.0 oz/week    Review of Systems Constitutional: No fever/chills Eyes: Positive for blurry vision. ENT: No sore throat. Cardiovascular: Denies chest pain. Respiratory: Denies shortness of breath. Gastrointestinal: No abdominal pain.  No nausea, no vomiting.  No diarrhea.   Genitourinary: Positive for incontinence.  Musculoskeletal: Positive for muscle cramps. Skin: Negative for rash. Neurological: Positive for heaviness.    ____________________________________________   PHYSICAL EXAM:  VITAL SIGNS: ED Triage Vitals [08/30/16 1544]  Enc Vitals Group     BP 105/78     Pulse Rate 63     Resp 16     Temp 98.1 F (36.7 C)     Temp Source Oral     SpO2 98 %     Weight 210 lb (95.3 kg)   Constitutional: Alert and oriented. Well appearing and in no distress. Eyes: Conjunctivae are normal.  ENT   Head: Normocephalic and atraumatic.   Nose: No congestion/rhinnorhea.   Mouth/Throat: Mucous membranes are moist.   Neck: No stridor. Hematological/Lymphatic/Immunilogical: No cervical lymphadenopathy. Cardiovascular: Normal rate, regular rhythm.  No murmurs, rubs, or gallops. Respiratory: Normal respiratory effort without tachypnea nor retractions. Breath sounds are clear and equal bilaterally. No wheezes/rales/rhonchi. Gastrointestinal: Soft and non tender. No rebound. No guarding.  Genitourinary: Deferred Musculoskeletal: Normal range of motion in all extremities. No lower extremity edema. Neurologic:  Normal speech and language. No gross focal neurologic deficits are appreciated.  Skin:  Skin is warm, dry and intact. No rash noted. Psychiatric: Mood and affect are normal. Speech and behavior are normal. Patient exhibits appropriate insight and judgment.  ____________________________________________    LABS (pertinent positives/negatives)  Labs Reviewed  BASIC METABOLIC PANEL - Abnormal; Notable for the following:       Result Value   Potassium 3.4 (*)    BUN 23 (*)    All other components within normal limits  CBC - Abnormal; Notable for the following:    RBC 5.66 (*)    RDW 14.7 (*)    All other components within normal limits  URINALYSIS, COMPLETE (UACMP) WITH MICROSCOPIC - Abnormal; Notable for the following:    Color, Urine YELLOW (*)    APPearance CLEAR (*)    Leukocytes, UA MODERATE (*)    Squamous Epithelial / LPF 0-5 (*)    All other components within normal limits  CBG  MONITORING, ED     ____________________________________________   EKG  I, Phineas Semen, attending physician, personally viewed and interpreted this EKG  EKG Time: 1558 Rate: 54 Rhythm: sinus bradycardia Axis: normal Intervals: qtc 390 QRS: narrow ST changes: no st elevation Impression: abnormal ekg   ____________________________________________    RADIOLOGY  CT head IMPRESSION:  No acute intracranial abnormality.    ____________________________________________   PROCEDURES  Procedures  ____________________________________________   INITIAL IMPRESSION / ASSESSMENT AND PLAN / ED COURSE  Pertinent labs & imaging results that were available during my care of the patient were reviewed by me and considered in my medical decision making (see chart for details).  Patient presented to the emergency department today because of what sounds like a seizure-like activity that occurred overnight. She was incontinent, she was confused. She had muscle cramping. The time of my examination she is neurologically intact. Workup here without any obvious etiology. This point I think patient is safe for discharge. She is already established care  with a neurologist. Discussed with patient importance of not driving or putting herself in a situation that would be dangerous for herself or others until she is cleared by her neurologist.  ____________________________________________   FINAL CLINICAL IMPRESSION(S) / ED DIAGNOSES  Final diagnoses:  Seizure-like activity (HCC)     Note: This dictation was prepared with Dragon dictation. Any transcriptional errors that result from this process are unintentional     Phineas SemenGoodman, Alanni Vader, MD 08/30/16 2028

## 2016-09-11 ENCOUNTER — Other Ambulatory Visit: Payer: Self-pay | Admitting: Nurse Practitioner

## 2016-09-11 DIAGNOSIS — R569 Unspecified convulsions: Secondary | ICD-10-CM

## 2016-09-22 ENCOUNTER — Ambulatory Visit
Admission: RE | Admit: 2016-09-22 | Discharge: 2016-09-22 | Disposition: A | Discharge status: Home / Self Care | Payer: Medicare HMO | Source: Ambulatory Visit | Attending: Nurse Practitioner | Admitting: Nurse Practitioner

## 2016-09-22 DIAGNOSIS — R569 Unspecified convulsions: Secondary | ICD-10-CM | POA: Diagnosis present

## 2016-09-22 MED ORDER — GADOBENATE DIMEGLUMINE 529 MG/ML IV SOLN
20.0000 mL | Freq: Once | INTRAVENOUS | Status: AC | PRN
  Administered 2016-09-22: 20 mL via INTRAVENOUS

## 2017-05-06 IMAGING — CT CT RENAL STONE PROTOCOL
2 of 4 series · 17 of 46 positions shown, 19 images · non-contrast
Comparison: None.

CLINICAL DATA: Stabbing low back pain today with urinary frequency
and dysuria.

EXAM:
CT ABDOMEN AND PELVIS WITHOUT CONTRAST
TECHNIQUE: Multidetector CT imaging of the abdomen and pelvis was performed
following the standard protocol without IV contrast.

[Series 2: axial st · axial · 0.86mm/px · z∈[-1078,-643]mm · 14 of 95 slices shown, 16 images]
[im 4/95  soft-tissue]
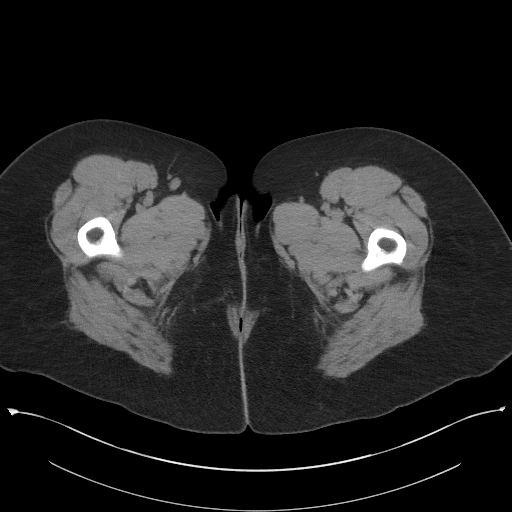
[im 4/95  bone]
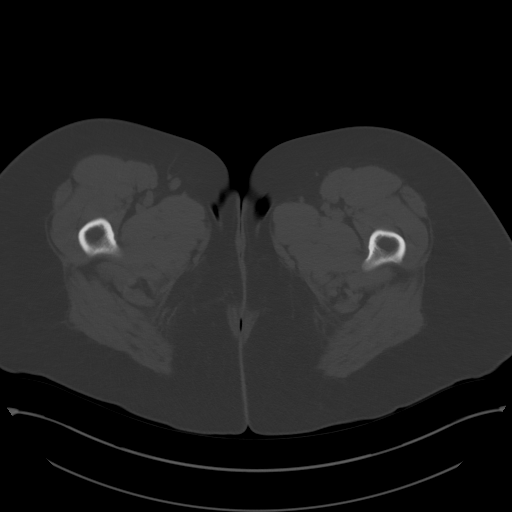
[im 12/95  soft-tissue]
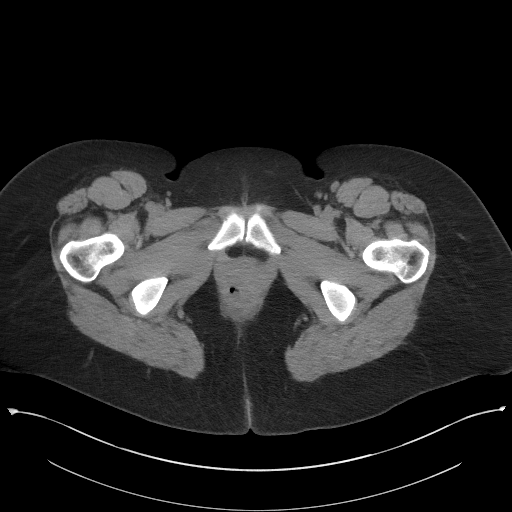
[im 20/95  soft-tissue]
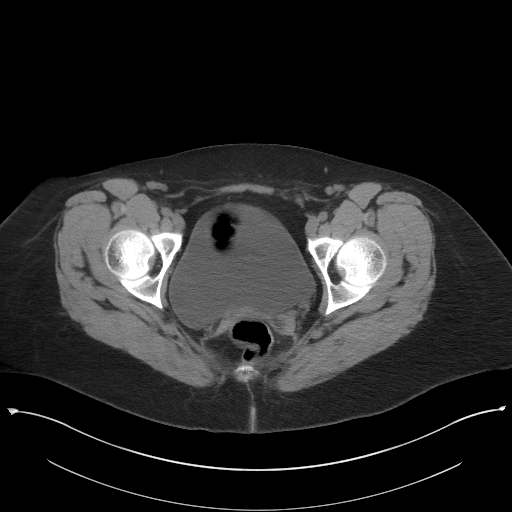
[im 24/95  soft-tissue]
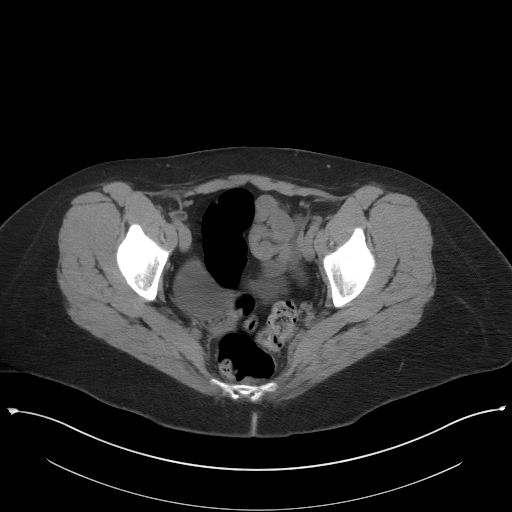
[im 32/95  soft-tissue]
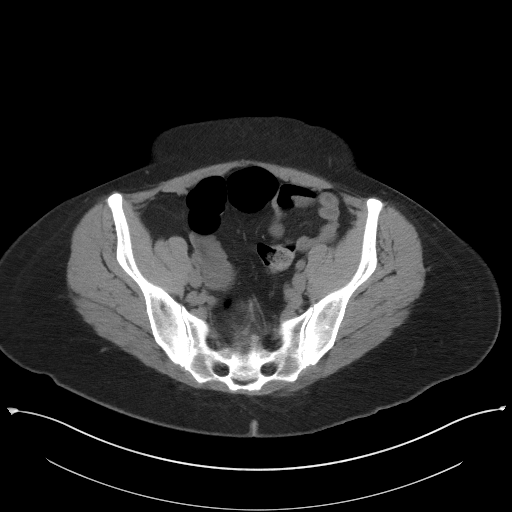
[im 40/95  soft-tissue]
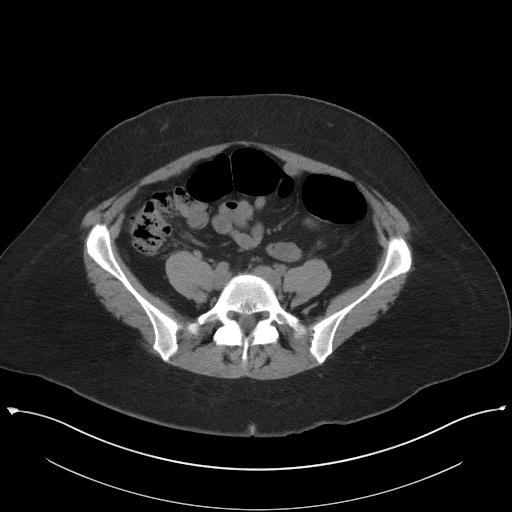
[im 44/95  soft-tissue]
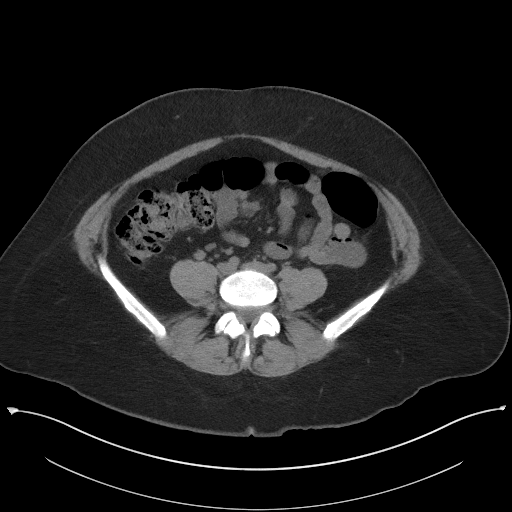
[im 51/95  soft-tissue]
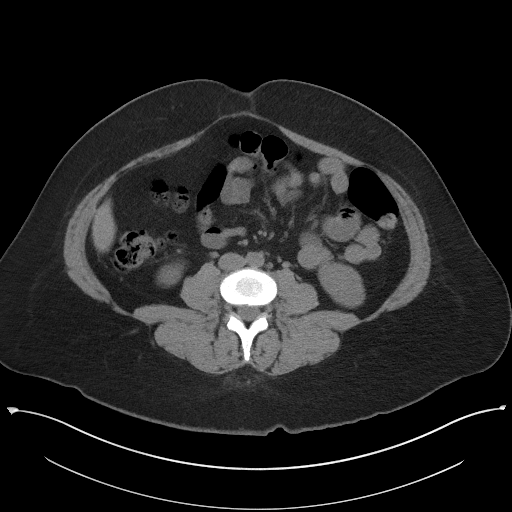
[im 55/95  soft-tissue]
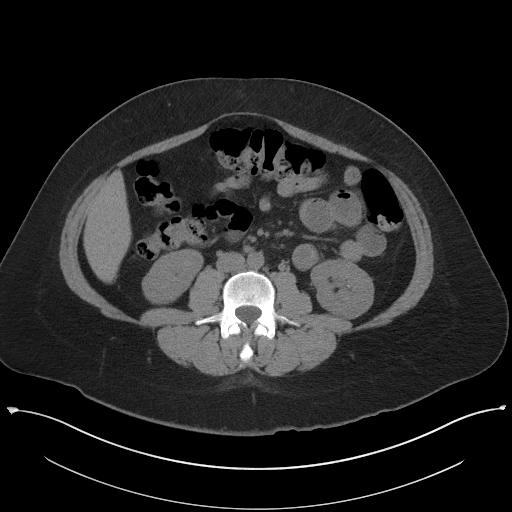
[im 55/95  bone]
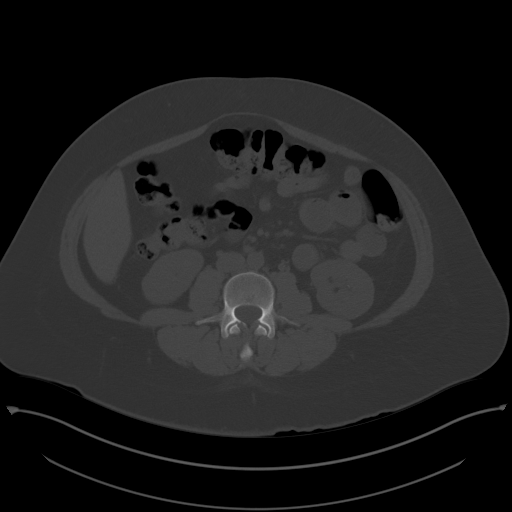
[im 63/95  soft-tissue]
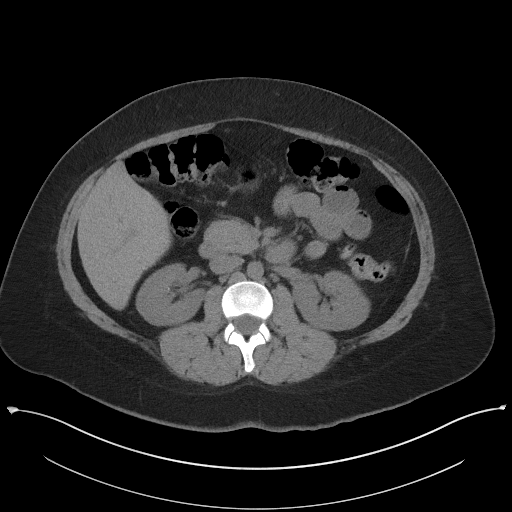
[im 71/95  soft-tissue]
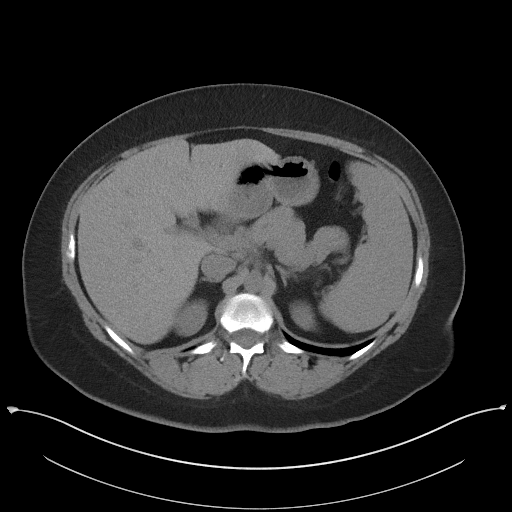
[im 75/95  soft-tissue]
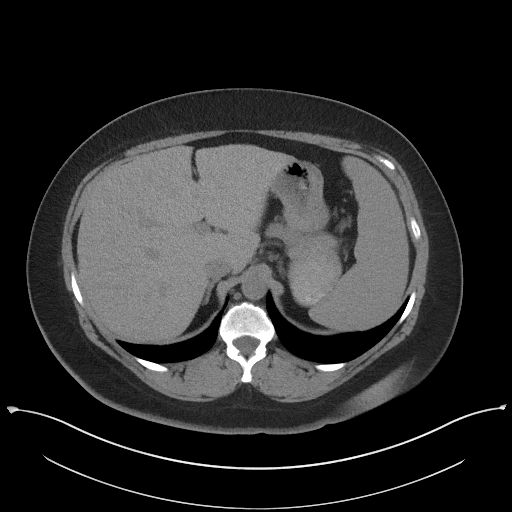
[im 83/95  soft-tissue]
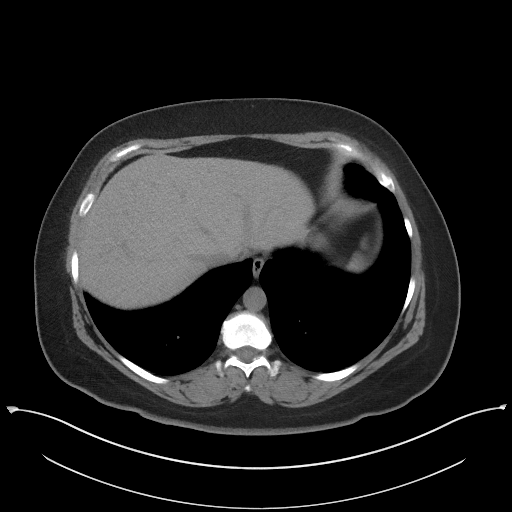
[im 91/95  soft-tissue]
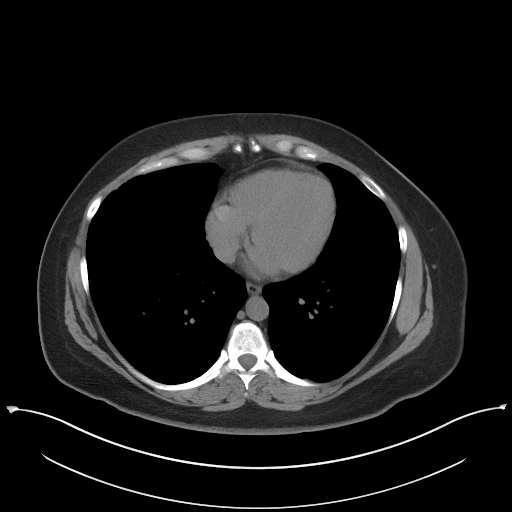

[Series 5: coronal · coronal · 0.86mm/px · 3 of 151 slices shown]
[im 51/151  soft-tissue]
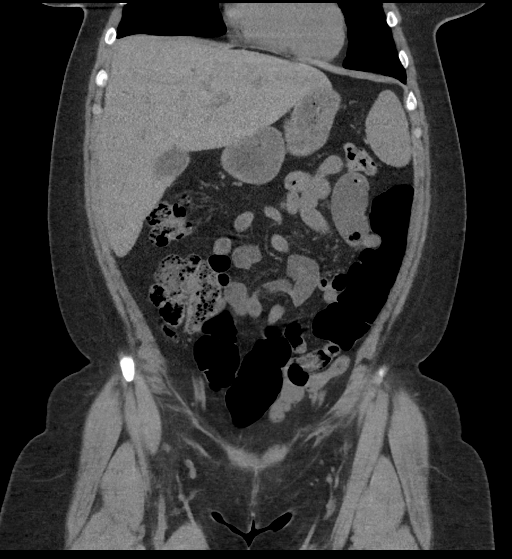
[im 67/151  soft-tissue]
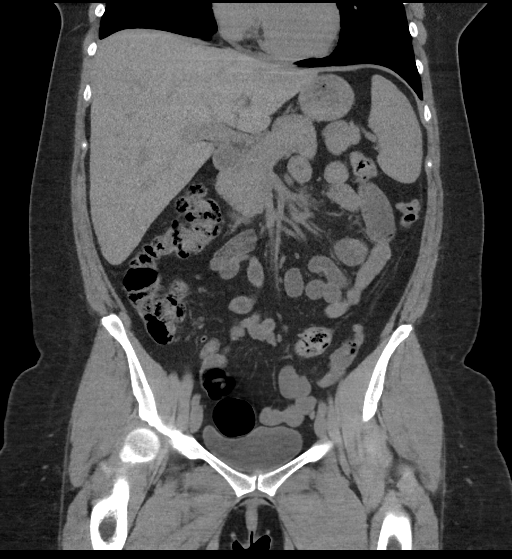
[im 84/151  soft-tissue]
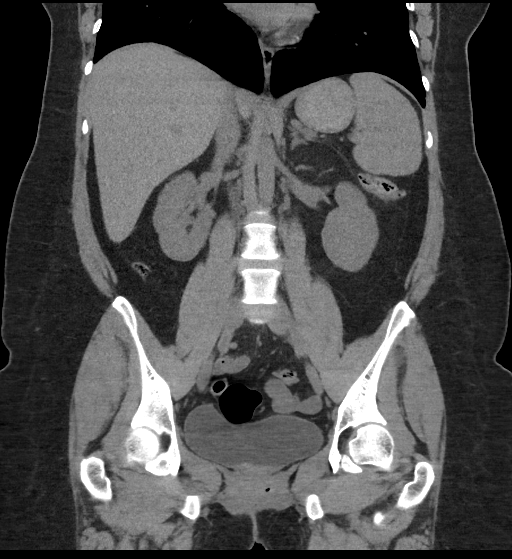

[17 of 46 positions shown; findings below may reference images not displayed]

FINDINGS: Lower chest: No acute abnormality.

Hepatobiliary: No focal liver abnormality is seen. No gallstones,
gallbladder wall thickening, or biliary dilatation.

Pancreas: Unremarkable. No pancreatic ductal dilatation or
surrounding inflammatory changes.

Spleen: Normal in size without focal abnormality.

Adrenals/Urinary Tract: Small nonobstructing kidney stones are
identified in both kidneys. There is no hydronephrosis bilaterally.
The adrenal glands are normal. The bladder is normal.

Stomach/Bowel: Stomach is within normal limits. No evidence of bowel
wall thickening, distention, or inflammatory changes. Appendix
appears normal.

Vascular/Lymphatic: No significant vascular findings are present. No
enlarged abdominal or pelvic lymph nodes.

Reproductive: Status post hysterectomy. No adnexal masses.

Other: There are small bilateral inguinal herniation of mesenteric
fat and small herniation of mesenteric fat in the umbilicus.

Musculoskeletal: No acute or significant osseous findings.
IMPRESSION: Bilateral nephrolithiasis without hydroureteronephrosis bilaterally.

Prior hysterectomy.
